# Patient Record
Sex: Female | Born: 1955
Health system: Southern US, Community
[De-identification: ages and names within clinical notes are randomized; demographics above are authoritative.]

## PROBLEM LIST (undated history)

## (undated) DIAGNOSIS — E785 Hyperlipidemia, unspecified: Secondary | ICD-10-CM

## (undated) DIAGNOSIS — Z860100 Personal history of colon polyps, unspecified: Secondary | ICD-10-CM

## (undated) DIAGNOSIS — E119 Type 2 diabetes mellitus without complications: Secondary | ICD-10-CM

## (undated) DIAGNOSIS — L680 Hirsutism: Secondary | ICD-10-CM

## (undated) DIAGNOSIS — E669 Obesity, unspecified: Secondary | ICD-10-CM

## (undated) DIAGNOSIS — E039 Hypothyroidism, unspecified: Secondary | ICD-10-CM

## (undated) HISTORY — PX: OTHER SURGICAL HISTORY: SHX169

## (undated) HISTORY — PX: COLONOSCOPY: SHX174

## (undated) HISTORY — PX: PILONIDAL CYST / SINUS EXCISION: SUR543

---

## 2004-01-07 ENCOUNTER — Ambulatory Visit: Payer: Self-pay | Admitting: Family Medicine

## 2005-01-05 ENCOUNTER — Ambulatory Visit: Payer: Self-pay | Admitting: Internal Medicine

## 2006-01-06 ENCOUNTER — Ambulatory Visit: Payer: Self-pay | Admitting: Internal Medicine

## 2006-01-12 ENCOUNTER — Ambulatory Visit: Payer: Self-pay | Admitting: Gastroenterology

## 2007-01-10 ENCOUNTER — Ambulatory Visit: Payer: Self-pay | Admitting: Internal Medicine

## 2008-01-12 ENCOUNTER — Ambulatory Visit: Payer: Self-pay | Admitting: Internal Medicine

## 2009-01-14 ENCOUNTER — Ambulatory Visit: Payer: Self-pay | Admitting: Unknown Physician Specialty

## 2010-01-15 ENCOUNTER — Ambulatory Visit: Payer: Self-pay | Admitting: Internal Medicine

## 2010-07-27 ENCOUNTER — Emergency Department: Payer: Self-pay | Admitting: Emergency Medicine

## 2010-07-31 ENCOUNTER — Ambulatory Visit: Payer: Self-pay | Admitting: Internal Medicine

## 2010-08-10 ENCOUNTER — Ambulatory Visit: Payer: Self-pay | Admitting: Internal Medicine

## 2010-09-10 ENCOUNTER — Ambulatory Visit: Payer: Self-pay | Admitting: Internal Medicine

## 2011-02-16 ENCOUNTER — Ambulatory Visit: Payer: Self-pay | Admitting: Unknown Physician Specialty

## 2012-02-24 ENCOUNTER — Ambulatory Visit: Payer: Self-pay | Admitting: Internal Medicine

## 2013-03-10 ENCOUNTER — Ambulatory Visit: Payer: Self-pay | Admitting: Family Medicine

## 2014-03-22 ENCOUNTER — Ambulatory Visit: Payer: Self-pay | Admitting: Family Medicine

## 2014-04-10 ENCOUNTER — Emergency Department: Payer: Self-pay | Admitting: Emergency Medicine

## 2015-01-10 HISTORY — PX: BREAST BIOPSY: SHX20

## 2015-03-21 ENCOUNTER — Other Ambulatory Visit: Payer: Self-pay | Admitting: Family Medicine

## 2015-03-21 DIAGNOSIS — Z1231 Encounter for screening mammogram for malignant neoplasm of breast: Secondary | ICD-10-CM

## 2015-03-26 ENCOUNTER — Ambulatory Visit
Admission: RE | Admit: 2015-03-26 | Discharge: 2015-03-26 | Disposition: A | Discharge status: Home / Self Care | Payer: BLUE CROSS/BLUE SHIELD | Source: Ambulatory Visit | Attending: Family Medicine | Admitting: Family Medicine

## 2015-03-26 ENCOUNTER — Ambulatory Visit: Admission: RE | Admit: 2015-03-26 | Payer: Self-pay | Source: Ambulatory Visit

## 2015-03-26 DIAGNOSIS — Z1231 Encounter for screening mammogram for malignant neoplasm of breast: Secondary | ICD-10-CM | POA: Diagnosis not present

## 2016-03-06 ENCOUNTER — Encounter: Payer: Self-pay | Admitting: *Deleted

## 2016-03-09 ENCOUNTER — Ambulatory Visit: Payer: BLUE CROSS/BLUE SHIELD | Admitting: Anesthesiology

## 2016-03-09 ENCOUNTER — Encounter: Payer: Self-pay | Admitting: Anesthesiology

## 2016-03-09 ENCOUNTER — Encounter
Admission: RE | Disposition: A | Discharge status: Home / Self Care | Payer: Self-pay | Source: Ambulatory Visit | Attending: Gastroenterology

## 2016-03-09 ENCOUNTER — Ambulatory Visit
Admission: RE | Admit: 2016-03-09 | Discharge: 2016-03-09 | Disposition: A | Discharge status: Home / Self Care | Payer: BLUE CROSS/BLUE SHIELD | Source: Ambulatory Visit | Attending: Gastroenterology | Admitting: Gastroenterology

## 2016-03-09 DIAGNOSIS — K5730 Diverticulosis of large intestine without perforation or abscess without bleeding: Secondary | ICD-10-CM | POA: Diagnosis not present

## 2016-03-09 DIAGNOSIS — Z1211 Encounter for screening for malignant neoplasm of colon: Secondary | ICD-10-CM | POA: Insufficient documentation

## 2016-03-09 DIAGNOSIS — E119 Type 2 diabetes mellitus without complications: Secondary | ICD-10-CM | POA: Insufficient documentation

## 2016-03-09 DIAGNOSIS — E785 Hyperlipidemia, unspecified: Secondary | ICD-10-CM | POA: Diagnosis not present

## 2016-03-09 DIAGNOSIS — Z86010 Personal history of colonic polyps: Secondary | ICD-10-CM | POA: Insufficient documentation

## 2016-03-09 DIAGNOSIS — F17200 Nicotine dependence, unspecified, uncomplicated: Secondary | ICD-10-CM | POA: Insufficient documentation

## 2016-03-09 DIAGNOSIS — K635 Polyp of colon: Secondary | ICD-10-CM | POA: Diagnosis not present

## 2016-03-09 DIAGNOSIS — Z6826 Body mass index (BMI) 26.0-26.9, adult: Secondary | ICD-10-CM | POA: Diagnosis not present

## 2016-03-09 DIAGNOSIS — Z79899 Other long term (current) drug therapy: Secondary | ICD-10-CM | POA: Insufficient documentation

## 2016-03-09 DIAGNOSIS — D128 Benign neoplasm of rectum: Secondary | ICD-10-CM | POA: Insufficient documentation

## 2016-03-09 DIAGNOSIS — E039 Hypothyroidism, unspecified: Secondary | ICD-10-CM | POA: Diagnosis not present

## 2016-03-09 DIAGNOSIS — D122 Benign neoplasm of ascending colon: Secondary | ICD-10-CM | POA: Insufficient documentation

## 2016-03-09 DIAGNOSIS — Z794 Long term (current) use of insulin: Secondary | ICD-10-CM | POA: Insufficient documentation

## 2016-03-09 HISTORY — DX: Personal history of colonic polyps: Z86.010

## 2016-03-09 HISTORY — DX: Hypothyroidism, unspecified: E03.9

## 2016-03-09 HISTORY — PX: COLONOSCOPY WITH PROPOFOL: SHX5780

## 2016-03-09 HISTORY — DX: Obesity, unspecified: E66.9

## 2016-03-09 HISTORY — DX: Hyperlipidemia, unspecified: E78.5

## 2016-03-09 HISTORY — DX: Hirsutism: L68.0

## 2016-03-09 HISTORY — DX: Type 2 diabetes mellitus without complications: E11.9

## 2016-03-09 HISTORY — DX: Personal history of colon polyps, unspecified: Z86.0100

## 2016-03-09 LAB — GLUCOSE, CAPILLARY: Glucose-Capillary: 138 mg/dL — ABNORMAL HIGH (ref 65–99)

## 2016-03-09 SURGERY — COLONOSCOPY WITH PROPOFOL
Anesthesia: General

## 2016-03-09 MED ORDER — SODIUM CHLORIDE 0.9 % IV SOLN
INTRAVENOUS | Status: DC
  Administered 2016-03-09: 1000 mL via INTRAVENOUS

## 2016-03-09 MED ORDER — EPHEDRINE SULFATE 50 MG/ML IJ SOLN
INTRAMUSCULAR | Status: DC | PRN
  Administered 2016-03-09: 10 mg via INTRAVENOUS
  Administered 2016-03-09: 20 mg via INTRAVENOUS
  Administered 2016-03-09 (×2): 10 mg via INTRAVENOUS

## 2016-03-09 MED ORDER — PROPOFOL 500 MG/50ML IV EMUL
INTRAVENOUS | Status: AC
  Filled 2016-03-09: qty 50

## 2016-03-09 MED ORDER — EPHEDRINE 5 MG/ML INJ
INTRAVENOUS | Status: AC
  Filled 2016-03-09: qty 10

## 2016-03-09 MED ORDER — LIDOCAINE HCL (CARDIAC) 20 MG/ML IV SOLN
INTRAVENOUS | Status: DC | PRN
  Administered 2016-03-09: 50 mg via INTRAVENOUS

## 2016-03-09 MED ORDER — PROPOFOL 10 MG/ML IV BOLUS
INTRAVENOUS | Status: DC | PRN
  Administered 2016-03-09 (×2): 50 mg via INTRAVENOUS
  Administered 2016-03-09: 20 mg via INTRAVENOUS
  Administered 2016-03-09 (×2): 30 mg via INTRAVENOUS
  Administered 2016-03-09: 10 mg via INTRAVENOUS
  Administered 2016-03-09: 30 mg via INTRAVENOUS
  Administered 2016-03-09: 50 mg via INTRAVENOUS
  Administered 2016-03-09: 20 mg via INTRAVENOUS
  Administered 2016-03-09 (×2): 50 mg via INTRAVENOUS

## 2016-03-09 MED ORDER — LACTATED RINGERS IV SOLN
INTRAVENOUS | Status: DC | PRN
  Administered 2016-03-09: 08:00:00 via INTRAVENOUS

## 2016-03-09 MED ORDER — PROPOFOL 10 MG/ML IV BOLUS
INTRAVENOUS | Status: AC
  Filled 2016-03-09: qty 20

## 2016-03-09 MED ORDER — SODIUM CHLORIDE 0.9 % IV SOLN: INTRAVENOUS | Status: DC

## 2016-03-09 MED ORDER — PROPOFOL 500 MG/50ML IV EMUL
INTRAVENOUS | Status: DC | PRN
  Administered 2016-03-09: 125 ug/kg/min via INTRAVENOUS

## 2016-03-09 NOTE — Anesthesia Post-op Follow-up Note (Cosign Needed)
Anesthesia QCDR form completed.        

## 2016-03-09 NOTE — Anesthesia Postprocedure Evaluation (Signed)
Anesthesia Post Note  Patient: Lauren Pierce  Procedure(s) Performed: Procedure(s) (LRB): COLONOSCOPY WITH PROPOFOL (N/A)  Patient location during evaluation: Endoscopy Anesthesia Type: General Level of consciousness: awake and alert Pain management: pain level controlled Vital Signs Assessment: post-procedure vital signs reviewed and stable Respiratory status: spontaneous breathing, nonlabored ventilation, respiratory function stable and patient connected to nasal cannula oxygen Cardiovascular status: blood pressure returned to baseline and stable Postop Assessment: no signs of nausea or vomiting Anesthetic complications: no     Last Vitals:  Vitals:   03/09/16 0908 03/09/16 0918  BP: (!) 103/91 123/70  Pulse: 87 84  Resp: 18 14  Temp:      Last Pain:  Vitals:   03/09/16 0848  TempSrc: Tympanic                 Ashauna Bertholf S

## 2016-03-09 NOTE — H&P (Signed)
Outpatient short stay form Pre-procedure 03/09/2016 7:28 AM Lollie Sails MD  Primary Physician: Dr Derinda Late  Reason for visit:  Colonoscopy  History of present illness:  Patient is a 61 year old female presenting today as above. She has a history of colon polyps her last colonoscopy was 10 years ago. She did tolerate her prep well. She takes no aspirin or blood thinning agents.    Current Facility-Administered Medications:  .  0.9 %  sodium chloride infusion, , Intravenous, Continuous, Lollie Sails, MD, Last Rate: 20 mL/hr at 03/09/16 0702, 1,000 mL at 03/09/16 0702 .  0.9 %  sodium chloride infusion, , Intravenous, Continuous, Lollie Sails, MD  Prescriptions Prior to Admission  Medication Sig Dispense Refill Last Dose  . Acetylcarn-Alpha Lipoic Acid 400-200 MG CAPS Take 100 mg by mouth 3 (three) times daily.   Past Week at Unknown time  . calcium-vitamin D (OSCAL WITH D) 500-200 MG-UNIT tablet Take 1 tablet by mouth 2 (two) times daily.   Past Week at Unknown time  . Chromium Picolinate 200 MCG CAPS Take 200 mcg by mouth 3 (three) times daily.   Past Week at Unknown time  . Cinnamon Bark POWD 125 mg by Does not apply route 3 (three) times daily.   Past Week at Unknown time  . co-enzyme Q-10 50 MG capsule Take 100 mg by mouth 2 (two) times daily.   Past Week at Unknown time  . glucose blood test strip 1 each by Other route as needed for other. Use as instructed   Past Week at Unknown time  . Gymnema Sylvestris Leaf POWD 250 mg by Does not apply route 2 (two) times daily.   Past Week at Unknown time  . insulin aspart (NOVOLOG FLEXPEN) 100 UNIT/ML FlexPen Inject 7 Units into the skin 3 (three) times daily with meals.   Past Week at Unknown time  . insulin aspart (NOVOLOG) 100 UNIT/ML injection Inject 100 Units into the skin 3 (three) times daily before meals.   Past Week at Unknown time  . Insulin Glargine (TOUJEO SOLOSTAR [place]) Inject 15 Units into the skin Nightly.    03/08/2016 at Unknown time  . Insulin Glargine (TOUJEO SOLOSTAR) 300 UNIT/ML SOPN Inject 24 Units into the skin daily at 6 PM.   Past Week at Unknown time  . levothyroxine (SYNTHROID, LEVOTHROID) 75 MCG tablet Take 75 mcg by mouth daily before breakfast.   Past Week at Unknown time  . lisinopril (PRINIVIL,ZESTRIL) 5 MG tablet Take 5 mg by mouth daily.   Past Week at Unknown time  . metFORMIN (GLUCOPHAGE) 1000 MG tablet Take 1,000 mg by mouth 2 (two) times daily with a meal.   Past Week at Unknown time  . Multiple Vitamin (MULTIVITAMIN) tablet Take 1 tablet by mouth daily.   Past Week at Unknown time  . Nutritional Supplements (PYCNOGENOL PO) Take 50 mg by mouth 3 (three) times daily.   Past Week at Unknown time  . OMEGA-3 FATTY ACIDS-VITAMIN E PO Take 1,000 mg by mouth daily.   Past Week at Unknown time  . simvastatin (ZOCOR) 10 MG tablet Take 10 mg by mouth daily.   Past Week at Unknown time  . Turmeric, Curcuma [place], (TURMERIC ROOT) POWD by Does not apply route daily.   Past Week at Unknown time     No Known Allergies   Past Medical History:  Diagnosis Date  . Diabetes mellitus without complication ([place])   . Hirsutism   . History of  colon polyps   . Hyperlipidemia   . Hypothyroidism   . Obesity     Review of systems:      Physical Exam    Heart and lungs: Regular rate and rhythm without rub or gallop, lungs are bilaterally clear.    HEENT: Normocephalic atraumatic eyes are anicteric     Other:     Pertinant exam for procedure: Soft nontender nondistended bowel sounds positive normoactive.    Planned proceedures: Colonoscopy and indicated procedures. I have discussed the risks benefits and complications of procedures to include not limited to bleeding, infection, perforation and the risk of sedation and the patient wishes to proceed.    Lollie Sails, MD Gastroenterology 03/09/2016  7:28 AM

## 2016-03-09 NOTE — Transfer of Care (Signed)
Immediate Anesthesia Transfer of Care Note  Patient: Lauren Pierce  Procedure(s) Performed: Procedure(s): COLONOSCOPY WITH PROPOFOL (N/A)  Patient Location: PACU  Anesthesia Type:General  Level of Consciousness: sedated  Airway & Oxygen Therapy: Patient Spontanous Breathing  Post-op Assessment: Report given to RN and Post -op Vital signs reviewed and stable  Post vital signs: Reviewed and stable  Last Vitals:  Vitals:   03/09/16 0645  BP: (!) 113/56  Pulse: 86  Resp: 16  Temp: (!) 35.6 C    Last Pain:  Vitals:   03/09/16 0645  TempSrc: Tympanic         Complications: No apparent anesthesia complications

## 2016-03-09 NOTE — Brief Op Note (Signed)
Polyp cold snare in proximal transverse colon resected but not retrieved

## 2016-03-09 NOTE — Op Note (Signed)
[facility] Gastroenterology Patient Name: Lauren Pierce Procedure Date: 03/09/2016 7:01 AM MRN: [id number] Account #: [id number] Date of Birth: 31-May-1955 Admit Type: Outpatient Age: 61 Room: [facility] ENDO ROOM 3 Gender: Female Note Status: Finalized Procedure:            Colonoscopy Indications:          Personal history of colonic polyps Providers:            Lollie Sails, MD Referring MD:         Caprice Renshaw MD (Referring MD) Medicines:            Monitored Anesthesia Care Complications:        No immediate complications. Procedure:            Pre-Anesthesia Assessment:                       - ASA Grade Assessment: II - A patient with mild                        systemic disease.                       After obtaining informed consent, the colonoscope was                        passed under direct vision. Throughout the procedure,                        the patient's blood pressure, pulse, and oxygen                        saturations were monitored continuously. The                        Colonoscope was introduced through the anus and                        advanced to the the cecum, identified by appendiceal                        orifice and ileocecal valve. The colonoscopy was                        performed without difficulty. The patient tolerated the                        procedure well. The quality of the bowel preparation                        was fair. Findings:      Multiple small-mouthed diverticula were found in the sigmoid colon,       descending colon, transverse colon and ascending colon.      A 4 mm polyp was found in the proximal transverse colon. The polyp was       flat. The polyp was removed with a cold snare. Resection was complete,       but the polyp tissue was not retrieved.      A 14 mm polyp was found in the proximal ascending colon. The polyp was       sessile. The polyp was removed with a lift  and cut technique using a  hot       snare. The polyp was removed with a piecemeal technique using a hot       snare. The polyp was removed with a cold snare. Resection and retrieval       were complete.      A 3 mm polyp was found in the splenic flexure. The polyp was sessile.       The polyp was removed with a cold biopsy forceps. Resection and       retrieval were complete.      A 2 mm polyp was found in the descending colon. The polyp was sessile.       The polyp was removed with a cold biopsy forceps. Resection and       retrieval were complete.      A 2 mm polyp was found in the distal sigmoid colon. The polyp was       sessile. The polyp was removed with a cold biopsy forceps. Resection and       retrieval were complete.      A 10 mm polyp was found in the rectum. The polyp was semi-pedunculated.       The polyp was removed with a cold snare. Resection and retrieval were       complete.      The digital rectal exam was normal. Impression:           - Preparation of the colon was fair.                       - Diverticulosis in the sigmoid colon, in the                        descending colon, in the transverse colon and in the                        ascending colon.                       - One 4 mm polyp in the proximal transverse colon,                        removed with a cold snare. Complete resection. Polyp                        tissue not retrieved.                       - One 14 mm polyp in the proximal ascending colon,                        removed with a cold snare, removed using lift and cut                        and a hot snare and removed piecemeal using a hot                        snare. Resected and retrieved.                       - One 3 mm polyp at the splenic flexure, removed with a  cold biopsy forceps. Resected and retrieved.                       - One 2 mm polyp in the descending colon, removed with                        a cold biopsy forceps. Resected and  retrieved.                       - One 2 mm polyp in the distal sigmoid colon, removed                        with a cold biopsy forceps. Resected and retrieved.                       - One 10 mm polyp in the rectum, removed with a cold                        snare. Resected and retrieved. Recommendation:       - Discharge patient to home. Procedure Code(s):    --- Professional ---                       [phone], Colonoscopy, flexible; with removal of tumor(s),                        polyp(s), or other lesion(s) by snare technique                       45380, 29, Colonoscopy, flexible; with biopsy, single                        or multiple Diagnosis Code(s):    --- Professional ---                       D12.2, Benign neoplasm of ascending colon                       D12.3, Benign neoplasm of transverse colon (hepatic                        flexure or splenic flexure)                       D12.4, Benign neoplasm of descending colon                       D12.5, Benign neoplasm of sigmoid colon                       K62.1, Rectal polyp                       Z86.010, Personal history of colonic polyps                       K57.30, Diverticulosis of large intestine without                        perforation or abscess without bleeding CPT copyright 2016 American Medical Association. All rights reserved. The codes documented in this report  are preliminary and upon coder review may  be revised to meet current compliance requirements. Lollie Sails, MD 03/09/2016 8:49:13 AM This report has been signed electronically. Number of Addenda: 0 Note Initiated On: 03/09/2016 7:01 AM Scope Withdrawal Time: 0 hours 40 minutes 18 seconds  Total Procedure Duration: 0 hours 55 minutes 19 seconds       [facility]

## 2016-03-09 NOTE — Anesthesia Preprocedure Evaluation (Signed)
Anesthesia Evaluation  Patient identified by MRN, date of birth, ID band Patient awake    Reviewed: Allergy & Precautions, NPO status , Patient's Chart, lab work & pertinent test results, reviewed documented beta blocker date and time   Airway Mallampati: II  TM Distance: >3 FB     Dental  (+) Chipped   Pulmonary Current Smoker,           Cardiovascular      Neuro/Psych    GI/Hepatic   Endo/Other  diabetes, Type 2Hypothyroidism   Renal/GU      Musculoskeletal   Abdominal   Peds  Hematology   Anesthesia Other Findings   Reproductive/Obstetrics                             Anesthesia Physical Anesthesia Plan  ASA: II  Anesthesia Plan: General   Post-op Pain Management:    Induction: Intravenous  Airway Management Planned: Nasal Cannula  Additional Equipment:   Intra-op Plan:   Post-operative Plan:   Informed Consent: I have reviewed the patients History and Physical, chart, labs and discussed the procedure including the risks, benefits and alternatives for the proposed anesthesia with the patient or authorized representative who has indicated his/her understanding and acceptance.     Plan Discussed with: CRNA  Anesthesia Plan Comments:         Anesthesia Quick Evaluation

## 2016-03-10 ENCOUNTER — Encounter: Payer: Self-pay | Admitting: Gastroenterology

## 2016-03-10 LAB — SURGICAL PATHOLOGY

## 2016-03-19 ENCOUNTER — Other Ambulatory Visit: Payer: Self-pay | Admitting: Family Medicine

## 2016-03-19 DIAGNOSIS — Z1231 Encounter for screening mammogram for malignant neoplasm of breast: Secondary | ICD-10-CM

## 2016-03-30 ENCOUNTER — Ambulatory Visit: Payer: BLUE CROSS/BLUE SHIELD

## 2016-03-31 ENCOUNTER — Ambulatory Visit
Admission: RE | Admit: 2016-03-31 | Discharge: 2016-03-31 | Disposition: A | Discharge status: Home / Self Care | Payer: BLUE CROSS/BLUE SHIELD | Source: Ambulatory Visit | Attending: Family Medicine | Admitting: Family Medicine

## 2016-03-31 DIAGNOSIS — Z1231 Encounter for screening mammogram for malignant neoplasm of breast: Secondary | ICD-10-CM | POA: Insufficient documentation

## 2017-03-18 ENCOUNTER — Other Ambulatory Visit: Payer: Self-pay | Admitting: Family Medicine

## 2017-03-18 DIAGNOSIS — Z1231 Encounter for screening mammogram for malignant neoplasm of breast: Secondary | ICD-10-CM

## 2017-04-01 ENCOUNTER — Ambulatory Visit
Admission: RE | Admit: 2017-04-01 | Discharge: 2017-04-01 | Disposition: A | Discharge status: Home / Self Care | Payer: BLUE CROSS/BLUE SHIELD | Source: Ambulatory Visit | Attending: Family Medicine | Admitting: Family Medicine

## 2017-04-01 DIAGNOSIS — Z1231 Encounter for screening mammogram for malignant neoplasm of breast: Secondary | ICD-10-CM | POA: Insufficient documentation

## 2018-04-18 ENCOUNTER — Other Ambulatory Visit: Payer: Self-pay | Admitting: Family Medicine

## 2018-04-18 DIAGNOSIS — Z1231 Encounter for screening mammogram for malignant neoplasm of breast: Secondary | ICD-10-CM

## 2018-04-26 ENCOUNTER — Ambulatory Visit
Admission: RE | Admit: 2018-04-26 | Discharge: 2018-04-26 | Disposition: A | Discharge status: Home / Self Care | Payer: PRIVATE HEALTH INSURANCE | Source: Ambulatory Visit | Attending: Family Medicine | Admitting: Family Medicine

## 2018-04-26 ENCOUNTER — Other Ambulatory Visit: Payer: Self-pay

## 2018-04-26 ENCOUNTER — Encounter (INDEPENDENT_AMBULATORY_CARE_PROVIDER_SITE_OTHER): Payer: Self-pay

## 2018-04-26 DIAGNOSIS — Z1231 Encounter for screening mammogram for malignant neoplasm of breast: Secondary | ICD-10-CM | POA: Insufficient documentation

## 2020-07-05 ENCOUNTER — Other Ambulatory Visit: Payer: Self-pay | Admitting: Family Medicine

## 2020-07-05 DIAGNOSIS — Z1231 Encounter for screening mammogram for malignant neoplasm of breast: Secondary | ICD-10-CM

## 2020-07-16 ENCOUNTER — Other Ambulatory Visit: Payer: Self-pay | Admitting: *Deleted

## 2020-07-16 ENCOUNTER — Inpatient Hospital Stay
Admission: RE | Admit: 2020-07-16 | Discharge: 2020-07-16 | Disposition: A | Discharge status: Home / Self Care | Payer: Self-pay | Source: Ambulatory Visit | Attending: *Deleted | Admitting: *Deleted

## 2020-07-16 ENCOUNTER — Ambulatory Visit
Admission: RE | Admit: 2020-07-16 | Discharge: 2020-07-16 | Disposition: A | Discharge status: Home / Self Care | Payer: Medicare HMO | Source: Ambulatory Visit | Attending: Family Medicine | Admitting: Family Medicine

## 2020-07-16 ENCOUNTER — Other Ambulatory Visit: Payer: Self-pay

## 2020-07-16 DIAGNOSIS — Z1231 Encounter for screening mammogram for malignant neoplasm of breast: Secondary | ICD-10-CM

## 2020-08-23 DIAGNOSIS — E1069 Type 1 diabetes mellitus with other specified complication: Secondary | ICD-10-CM | POA: Diagnosis not present

## 2020-08-23 DIAGNOSIS — E109 Type 1 diabetes mellitus without complications: Secondary | ICD-10-CM | POA: Diagnosis not present

## 2020-08-23 DIAGNOSIS — E039 Hypothyroidism, unspecified: Secondary | ICD-10-CM | POA: Diagnosis not present

## 2020-08-23 DIAGNOSIS — E785 Hyperlipidemia, unspecified: Secondary | ICD-10-CM | POA: Diagnosis not present

## 2020-10-08 DIAGNOSIS — Z86010 Personal history of colonic polyps: Secondary | ICD-10-CM | POA: Diagnosis not present

## 2020-10-16 DIAGNOSIS — E109 Type 1 diabetes mellitus without complications: Secondary | ICD-10-CM | POA: Diagnosis not present

## 2020-11-07 DIAGNOSIS — Z79899 Other long term (current) drug therapy: Secondary | ICD-10-CM | POA: Diagnosis not present

## 2020-11-07 DIAGNOSIS — E1069 Type 1 diabetes mellitus with other specified complication: Secondary | ICD-10-CM | POA: Diagnosis not present

## 2020-11-07 DIAGNOSIS — E785 Hyperlipidemia, unspecified: Secondary | ICD-10-CM | POA: Diagnosis not present

## 2020-11-14 DIAGNOSIS — E785 Hyperlipidemia, unspecified: Secondary | ICD-10-CM | POA: Diagnosis not present

## 2020-11-14 DIAGNOSIS — E039 Hypothyroidism, unspecified: Secondary | ICD-10-CM | POA: Diagnosis not present

## 2020-11-14 DIAGNOSIS — Z79899 Other long term (current) drug therapy: Secondary | ICD-10-CM | POA: Diagnosis not present

## 2020-11-14 DIAGNOSIS — E119 Type 2 diabetes mellitus without complications: Secondary | ICD-10-CM | POA: Diagnosis not present

## 2020-11-15 DIAGNOSIS — E109 Type 1 diabetes mellitus without complications: Secondary | ICD-10-CM | POA: Diagnosis not present

## 2020-12-16 DIAGNOSIS — E109 Type 1 diabetes mellitus without complications: Secondary | ICD-10-CM | POA: Diagnosis not present

## 2021-01-10 DIAGNOSIS — D0461 Carcinoma in situ of skin of right upper limb, including shoulder: Secondary | ICD-10-CM | POA: Diagnosis not present

## 2021-01-10 DIAGNOSIS — D0362 Melanoma in situ of left upper limb, including shoulder: Secondary | ICD-10-CM | POA: Diagnosis not present

## 2021-01-13 DIAGNOSIS — L821 Other seborrheic keratosis: Secondary | ICD-10-CM | POA: Diagnosis not present

## 2021-01-13 DIAGNOSIS — D225 Melanocytic nevi of trunk: Secondary | ICD-10-CM | POA: Diagnosis not present

## 2021-01-13 DIAGNOSIS — D2262 Melanocytic nevi of left upper limb, including shoulder: Secondary | ICD-10-CM | POA: Diagnosis not present

## 2021-01-13 DIAGNOSIS — D485 Neoplasm of uncertain behavior of skin: Secondary | ICD-10-CM | POA: Diagnosis not present

## 2021-01-13 DIAGNOSIS — L570 Actinic keratosis: Secondary | ICD-10-CM | POA: Diagnosis not present

## 2021-01-13 DIAGNOSIS — D2271 Melanocytic nevi of right lower limb, including hip: Secondary | ICD-10-CM | POA: Diagnosis not present

## 2021-01-13 DIAGNOSIS — D2272 Melanocytic nevi of left lower limb, including hip: Secondary | ICD-10-CM | POA: Diagnosis not present

## 2021-01-13 DIAGNOSIS — X32XXXA Exposure to sunlight, initial encounter: Secondary | ICD-10-CM | POA: Diagnosis not present

## 2021-01-13 DIAGNOSIS — D2261 Melanocytic nevi of right upper limb, including shoulder: Secondary | ICD-10-CM | POA: Diagnosis not present

## 2021-01-15 DIAGNOSIS — E109 Type 1 diabetes mellitus without complications: Secondary | ICD-10-CM | POA: Diagnosis not present

## 2021-01-16 ENCOUNTER — Encounter: Payer: Self-pay | Admitting: *Deleted

## 2021-01-17 ENCOUNTER — Encounter
Admission: RE | Disposition: A | Discharge status: Home / Self Care | Payer: Self-pay | Source: Ambulatory Visit | Attending: Gastroenterology

## 2021-01-17 ENCOUNTER — Ambulatory Visit
Admission: RE | Admit: 2021-01-17 | Discharge: 2021-01-17 | Disposition: A | Discharge status: Home / Self Care | Payer: Medicare HMO | Source: Ambulatory Visit | Attending: Gastroenterology | Admitting: Gastroenterology

## 2021-01-17 ENCOUNTER — Ambulatory Visit: Payer: Medicare HMO | Admitting: Anesthesiology

## 2021-01-17 DIAGNOSIS — E669 Obesity, unspecified: Secondary | ICD-10-CM | POA: Insufficient documentation

## 2021-01-17 DIAGNOSIS — E785 Hyperlipidemia, unspecified: Secondary | ICD-10-CM | POA: Insufficient documentation

## 2021-01-17 DIAGNOSIS — Z6826 Body mass index (BMI) 26.0-26.9, adult: Secondary | ICD-10-CM | POA: Diagnosis not present

## 2021-01-17 DIAGNOSIS — E039 Hypothyroidism, unspecified: Secondary | ICD-10-CM | POA: Insufficient documentation

## 2021-01-17 DIAGNOSIS — K635 Polyp of colon: Secondary | ICD-10-CM | POA: Diagnosis not present

## 2021-01-17 DIAGNOSIS — E109 Type 1 diabetes mellitus without complications: Secondary | ICD-10-CM | POA: Insufficient documentation

## 2021-01-17 DIAGNOSIS — K5730 Diverticulosis of large intestine without perforation or abscess without bleeding: Secondary | ICD-10-CM | POA: Insufficient documentation

## 2021-01-17 DIAGNOSIS — Z1211 Encounter for screening for malignant neoplasm of colon: Secondary | ICD-10-CM | POA: Diagnosis not present

## 2021-01-17 DIAGNOSIS — Z86010 Personal history of colonic polyps: Secondary | ICD-10-CM | POA: Diagnosis not present

## 2021-01-17 DIAGNOSIS — Z09 Encounter for follow-up examination after completed treatment for conditions other than malignant neoplasm: Secondary | ICD-10-CM | POA: Diagnosis present

## 2021-01-17 DIAGNOSIS — F17200 Nicotine dependence, unspecified, uncomplicated: Secondary | ICD-10-CM | POA: Diagnosis not present

## 2021-01-17 HISTORY — PX: COLONOSCOPY WITH PROPOFOL: SHX5780

## 2021-01-17 LAB — GLUCOSE, CAPILLARY: Glucose-Capillary: 225 mg/dL — ABNORMAL HIGH (ref 70–99)

## 2021-01-17 SURGERY — COLONOSCOPY WITH PROPOFOL
Anesthesia: General

## 2021-01-17 MED ORDER — LIDOCAINE HCL (PF) 2 % IJ SOLN
INTRAMUSCULAR | Status: AC
  Filled 2021-01-17: qty 5

## 2021-01-17 MED ORDER — PROPOFOL 500 MG/50ML IV EMUL
INTRAVENOUS | Status: AC
  Filled 2021-01-17: qty 50

## 2021-01-17 MED ORDER — SODIUM CHLORIDE 0.9 % IV SOLN
INTRAVENOUS | Status: DC
  Administered 2021-01-17: 20 mL/h via INTRAVENOUS

## 2021-01-17 MED ORDER — LIDOCAINE HCL (CARDIAC) PF 100 MG/5ML IV SOSY
PREFILLED_SYRINGE | INTRAVENOUS | Status: DC | PRN
  Administered 2021-01-17: 50 mg via INTRAVENOUS

## 2021-01-17 MED ORDER — PROPOFOL 10 MG/ML IV BOLUS
INTRAVENOUS | Status: DC | PRN
  Administered 2021-01-17: 60 mg via INTRAVENOUS
  Administered 2021-01-17 (×2): 20 mg via INTRAVENOUS

## 2021-01-17 MED ORDER — PROPOFOL 500 MG/50ML IV EMUL
INTRAVENOUS | Status: DC | PRN
  Administered 2021-01-17: 150 ug/kg/min via INTRAVENOUS

## 2021-01-17 NOTE — Anesthesia Postprocedure Evaluation (Signed)
Anesthesia Post Note  Patient: Lauren Pierce  Procedure(s) Performed: COLONOSCOPY WITH PROPOFOL  Patient location during evaluation: Endoscopy Anesthesia Type: General Level of consciousness: awake and alert Pain management: pain level controlled Vital Signs Assessment: post-procedure vital signs reviewed and stable Respiratory status: spontaneous breathing, nonlabored ventilation, respiratory function stable and patient connected to nasal cannula oxygen Cardiovascular status: blood pressure returned to baseline and stable Postop Assessment: no apparent nausea or vomiting Anesthetic complications: no   No notable events documented.   Last Vitals:  Vitals:   01/17/21 0809 01/17/21 0819  BP: (!) 94/55 110/67  Pulse: 84 81  Resp: (!) 22 20  Temp: (!) 35.8 C   SpO2: 97% 98%    Last Pain:  Vitals:   01/17/21 0819  TempSrc:   PainSc: 0-No pain                 Arita Miss

## 2021-01-17 NOTE — Op Note (Signed)
[facility] Gastroenterology Patient Name: Lauren Pierce Procedure Date: 01/17/2021 6:50 AM MRN: [id number] Account #: [id number] Date of Birth: [birth year] Admit Type: Outpatient Age: 65 Room: [facility] ENDO ROOM 3 Gender: Female Note Status: Finalized Instrument Name: Park Meo [id number] Procedure:             Colonoscopy Indications:           Surveillance: Personal history of adenomatous polyps                         on last colonoscopy 3 years ago Providers:             Andrey Farmer MD, MD Referring MD:          Caprice Renshaw MD (Referring MD) Medicines:             Monitored Anesthesia Care Complications:         No immediate complications. Estimated blood loss:                         Minimal. Procedure:             Pre-Anesthesia Assessment:                        - Prior to the procedure, a History and Physical was                         performed, and patient medications and allergies were                         reviewed. The patient is competent. The risks and                         benefits of the procedure and the sedation options and                         risks were discussed with the patient. All questions                         were answered and informed consent was obtained.                         Patient identification and proposed procedure were                         verified by the physician, the nurse, the anesthetist                         and the technician in the endoscopy suite. Mental                         Status Examination: alert and oriented. Airway                         Examination: normal oropharyngeal airway and neck                         mobility. Respiratory Examination: clear to  auscultation. CV Examination: normal. Prophylactic                         Antibiotics: The patient does not require prophylactic                         antibiotics. Prior Anticoagulants: The patient has                          taken no previous anticoagulant or antiplatelet                         agents. ASA Grade Assessment: II - A patient with mild                         systemic disease. After reviewing the risks and                         benefits, the patient was deemed in satisfactory                         condition to undergo the procedure. The anesthesia                         plan was to use monitored anesthesia care (MAC).                         Immediately prior to administration of medications,                         the patient was re-assessed for adequacy to receive                         sedatives. The heart rate, respiratory rate, oxygen                         saturations, blood pressure, adequacy of pulmonary                         ventilation, and response to care were monitored                         throughout the procedure. The physical status of the                         patient was re-assessed after the procedure.                        After obtaining informed consent, the colonoscope was                         passed under direct vision. Throughout the procedure,                         the patient's blood pressure, pulse, and oxygen                         saturations were monitored continuously. The  Colonoscope was introduced through the anus and                         advanced to the the cecum, identified by appendiceal                         orifice and ileocecal valve. The colonoscopy was                         performed without difficulty. The patient tolerated                         the procedure well. The quality of the bowel                         preparation was good. Findings:      The perianal and digital rectal examinations were normal.      A 1 mm polyp was found in the transverse colon. The polyp was sessile.       The polyp was removed with a jumbo cold forceps. Resection and retrieval       were complete.  Estimated blood loss was minimal.      A few small-mouthed diverticula were found in the sigmoid colon.      The exam was otherwise without abnormality on direct and retroflexion       views. Impression:            - One 1 mm polyp in the transverse colon, removed with                         a jumbo cold forceps. Resected and retrieved.                        - Diverticulosis in the sigmoid colon.                        - The examination was otherwise normal on direct and                         retroflexion views. Recommendation:        - Discharge patient to home.                        - Resume previous diet.                        - Continue present medications.                        - Await pathology results.                        - Repeat colonoscopy in 5 years for surveillance.                        - Return to referring physician as previously                         scheduled. Procedure Code(s):     --- Professional ---  45380, Colonoscopy, flexible; with biopsy, single or                         multiple Diagnosis Code(s):     --- Professional ---                        Z86.010, Personal history of colonic polyps                        K63.5, Polyp of colon                        K57.30, Diverticulosis of large intestine without                         perforation or abscess without bleeding CPT copyright 2019 American Medical Association. All rights reserved. The codes documented in this report are preliminary and upon coder review may  be revised to meet current compliance requirements. Andrey Farmer MD, MD 01/17/2021 8:09:28 AM Number of Addenda: 0 Note Initiated On: 01/17/2021 6:50 AM Scope Withdrawal Time: 0 hours 9 minutes 30 seconds  Total Procedure Duration: 0 hours 15 minutes 7 seconds  Estimated Blood Loss:  Estimated blood loss was minimal.      [facility]

## 2021-01-17 NOTE — Interval H&P Note (Signed)
History and Physical Interval Note:  01/17/2021 7:47 AM  Lauren Pierce  has presented today for surgery, with the diagnosis of H/O Adenomatous Polyps (Z86.010).  The various methods of treatment have been discussed with the patient and family. After consideration of risks, benefits and other options for treatment, the patient has consented to  Procedure(s) with comments: COLONOSCOPY WITH PROPOFOL (N/A) - IDDM as a surgical intervention.  The patient's history has been reviewed, patient examined, no change in status, stable for surgery.  I have reviewed the patient's chart and labs.  Questions were answered to the patient's satisfaction.     Lesly Rubenstein  Ok to proceed with colonoscopy

## 2021-01-17 NOTE — Transfer of Care (Signed)
Immediate Anesthesia Transfer of Care Note  Patient: Lauren Pierce  Procedure(s) Performed: COLONOSCOPY WITH PROPOFOL  Patient Location: Endoscopy Unit  Anesthesia Type:General  Level of Consciousness: awake, alert  and oriented  Airway & Oxygen Therapy: Patient Spontanous Breathing  Post-op Assessment: Report given to RN and Post -op Vital signs reviewed and stable  Post vital signs: Reviewed and stable  Last Vitals:  Vitals Value Taken Time  BP 94/55 01/17/21 0809  Temp    Pulse 90 01/17/21 0809  Resp 22 01/17/21 0809  SpO2 99 % 01/17/21 0809  Vitals shown include unvalidated device data.  Last Pain:  Vitals:   01/17/21 0808  TempSrc:   PainSc: Asleep         Complications: No notable events documented.

## 2021-01-17 NOTE — H&P (Signed)
Outpatient short stay form Pre-procedure 01/17/2021  Lesly Rubenstein, MD  Primary Physician: Derinda Late, MD  Reason for visit:  Surveillance colonoscopy  History of present illness:    65 y/o lady with history of polyps here for surveillance colonoscopy. No blood thinners. No family history of GI malignancies. No abdominal surgeries.    Current Facility-Administered Medications:    0.9 %  sodium chloride infusion, , Intravenous, Continuous, Alic Hilburn, Hilton Cork, MD, Last Rate: 20 mL/hr at 01/17/21 0739, Continued from Pre-op at 01/17/21 0739  Medications Prior to Admission  Medication Sig Dispense Refill Last Dose   Acetylcarn-Alpha Lipoic Acid 400-200 MG CAPS Take 100 mg by mouth 3 (three) times daily.   Past Week   Ascorbic Acid (VITAMIN C) 1000 MG tablet Take 1,000 mg by mouth daily.   Past Week   calcium-vitamin D (OSCAL WITH D) 500-200 MG-UNIT tablet Take 1 tablet by mouth 2 (two) times daily.   Past Week   Cholecalciferol 25 MCG (1000 UT) tablet Take 1,000 Units by mouth daily.   Past Week   Chromium Picolinate 200 MCG CAPS Take 200 mcg by mouth 3 (three) times daily.   Past Week   Cinnamon Bark POWD 125 mg by Does not apply route 3 (three) times daily.   Past Week   co-enzyme Q-10 50 MG capsule Take 100 mg by mouth 2 (two) times daily.   Past Week   glucose blood test strip 1 each by Other route as needed for other. Use as instructed   Past Week   Gymnema Sylvestris Leaf POWD 250 mg by Does not apply route 2 (two) times daily.   Past Week   insulin aspart (NOVOLOG) 100 UNIT/ML FlexPen Inject 7 Units into the skin 3 (three) times daily with meals.   Past Week   insulin aspart (NOVOLOG) 100 UNIT/ML injection Inject 100 Units into the skin 3 (three) times daily before meals.   Past Week   Insulin Glargine (TOUJEO SOLOSTAR [place]) Inject 15 Units into the skin Nightly.   01/16/2021   Insulin Glargine 300 UNIT/ML SOPN Inject 24 Units into the skin daily at 6 PM.   Past Week    levothyroxine (SYNTHROID, LEVOTHROID) 75 MCG tablet Take 75 mcg by mouth daily before breakfast.   Past Week   lisinopril (PRINIVIL,ZESTRIL) 5 MG tablet Take 5 mg by mouth daily.   Past Week   metFORMIN (GLUCOPHAGE) 1000 MG tablet Take 1,000 mg by mouth 2 (two) times daily with a meal.   Past Week   Multiple Vitamin (MULTIVITAMIN) tablet Take 1 tablet by mouth daily.   Past Week   Nutritional Supplements (PYCNOGENOL PO) Take 50 mg by mouth 3 (three) times daily.   Past Week   OMEGA-3 FATTY ACIDS-VITAMIN E PO Take 1,000 mg by mouth daily.   Past Week   simvastatin (ZOCOR) 10 MG tablet Take 10 mg by mouth daily.   Past Week   Turmeric, Cur[place]uma [place], (TURMERIC ROOT) POWD by Does not apply route daily.   Past Week     No Known Allergies   Past Medical History:  Diagnosis Date   Diabetes mellitus without complication ([place])    Hirsutism    History of colon polyps    Hyperlipidemia    Hypothyroidism    Obesity     Review of systems:  Otherwise negative.    Physical Exam  Gen: Alert, oriented. Appears stated age.  HEENT: PERRLA. Lungs: No respiratory distress CV: RRR Abd: soft, benign, no  masses Ext: No edema    Planned procedures: Proceed with colonoscopy. The patient understands the nature of the planned procedure, indications, risks, alternatives and potential complications including but not limited to bleeding, infection, perforation, damage to internal organs and possible oversedation/side effects from anesthesia. The patient agrees and gives consent to proceed.  Please refer to procedure notes for findings, recommendations and patient disposition/instructions.     Lesly Rubenstein, MD [facility]

## 2021-01-17 NOTE — Anesthesia Preprocedure Evaluation (Signed)
Anesthesia Evaluation  Patient identified by MRN, date of birth, ID band Patient awake    Reviewed: Allergy & Precautions, NPO status , Patient's Chart, lab work & pertinent test results  History of Anesthesia Complications Negative for: history of anesthetic complications  Airway Mallampati: II  TM Distance: <3 FB Neck ROM: Full    Dental no notable dental hx. (+) Teeth Intact   Pulmonary neg sleep apnea, neg COPD, Current SmokerPatient did not abstain from smoking.,    Pulmonary exam normal breath sounds clear to auscultation       Cardiovascular Exercise Tolerance: Good METS(-) hypertension(-) CAD and (-) Past MI negative cardio ROS  (-) dysrhythmias  Rhythm:Regular Rate:Normal - Systolic murmurs    Neuro/Psych negative neurological ROS  negative psych ROS   GI/Hepatic neg GERD  ,(+)     (-) substance abuse  ,   Endo/Other  diabetes, Well Controlled, Type 1Hypothyroidism   Renal/GU negative Renal ROS     Musculoskeletal   Abdominal   Peds  Hematology   Anesthesia Other Findings Past Medical History: No date: Diabetes mellitus without complication ([facility]) No date: Hirsutism No date: History of colon polyps No date: Hyperlipidemia No date: Hypothyroidism No date: Obesity  Reproductive/Obstetrics                             Anesthesia Physical Anesthesia Plan  ASA: 3  Anesthesia Plan: General   Post-op Pain Management: Minimal or no pain anticipated   Induction: Intravenous  PONV Risk Score and Plan: 3 and Ondansetron, Propofol infusion and TIVA  Airway Management Planned: Nasal Cannula  Additional Equipment: None  Intra-op Plan:   Post-operative Plan:   Informed Consent: I have reviewed the patients History and Physical, chart, labs and discussed the procedure including the risks, benefits and alternatives for the proposed anesthesia with the patient or authorized  representative who has indicated his/her understanding and acceptance.     Dental advisory given  Plan Discussed with: CRNA and Surgeon  Anesthesia Plan Comments: (Discussed risks of anesthesia with patient, including possibility of difficulty with spontaneous ventilation under anesthesia necessitating airway intervention, PONV, and rare risks such as cardiac or respiratory or neurological events, and allergic reactions. Discussed the role of CRNA in patient's perioperative care. Patient understands. Patient counseled on benefits of smoking cessation, and increased perioperative risks associated with continued smoking. )       Anesthesia Quick Evaluation

## 2021-01-20 ENCOUNTER — Encounter: Payer: Self-pay | Admitting: Gastroenterology

## 2021-01-20 LAB — SURGICAL PATHOLOGY

## 2021-02-28 DIAGNOSIS — E1069 Type 1 diabetes mellitus with other specified complication: Secondary | ICD-10-CM | POA: Diagnosis not present

## 2021-02-28 DIAGNOSIS — E785 Hyperlipidemia, unspecified: Secondary | ICD-10-CM | POA: Diagnosis not present

## 2021-02-28 DIAGNOSIS — E039 Hypothyroidism, unspecified: Secondary | ICD-10-CM | POA: Diagnosis not present

## 2021-03-17 DIAGNOSIS — L905 Scar conditions and fibrosis of skin: Secondary | ICD-10-CM | POA: Diagnosis not present

## 2021-03-17 DIAGNOSIS — D0362 Melanoma in situ of left upper limb, including shoulder: Secondary | ICD-10-CM | POA: Diagnosis not present

## 2021-03-31 DIAGNOSIS — D0461 Carcinoma in situ of skin of right upper limb, including shoulder: Secondary | ICD-10-CM | POA: Diagnosis not present

## 2021-05-13 DIAGNOSIS — E785 Hyperlipidemia, unspecified: Secondary | ICD-10-CM | POA: Diagnosis not present

## 2021-05-13 DIAGNOSIS — Z79899 Other long term (current) drug therapy: Secondary | ICD-10-CM | POA: Diagnosis not present

## 2021-05-13 DIAGNOSIS — E1069 Type 1 diabetes mellitus with other specified complication: Secondary | ICD-10-CM | POA: Diagnosis not present

## 2021-05-20 DIAGNOSIS — Z0000 Encounter for general adult medical examination without abnormal findings: Secondary | ICD-10-CM | POA: Diagnosis not present

## 2021-07-29 DIAGNOSIS — D2261 Melanocytic nevi of right upper limb, including shoulder: Secondary | ICD-10-CM | POA: Diagnosis not present

## 2021-07-29 DIAGNOSIS — X32XXXA Exposure to sunlight, initial encounter: Secondary | ICD-10-CM | POA: Diagnosis not present

## 2021-07-29 DIAGNOSIS — D2262 Melanocytic nevi of left upper limb, including shoulder: Secondary | ICD-10-CM | POA: Diagnosis not present

## 2021-07-29 DIAGNOSIS — L570 Actinic keratosis: Secondary | ICD-10-CM | POA: Diagnosis not present

## 2021-07-29 DIAGNOSIS — L821 Other seborrheic keratosis: Secondary | ICD-10-CM | POA: Diagnosis not present

## 2021-07-29 DIAGNOSIS — D225 Melanocytic nevi of trunk: Secondary | ICD-10-CM | POA: Diagnosis not present

## 2021-07-29 DIAGNOSIS — D2272 Melanocytic nevi of left lower limb, including hip: Secondary | ICD-10-CM | POA: Diagnosis not present

## 2021-07-29 DIAGNOSIS — D2271 Melanocytic nevi of right lower limb, including hip: Secondary | ICD-10-CM | POA: Diagnosis not present

## 2021-08-29 DIAGNOSIS — E1069 Type 1 diabetes mellitus with other specified complication: Secondary | ICD-10-CM | POA: Diagnosis not present

## 2021-08-29 DIAGNOSIS — E039 Hypothyroidism, unspecified: Secondary | ICD-10-CM | POA: Diagnosis not present

## 2021-08-29 DIAGNOSIS — E785 Hyperlipidemia, unspecified: Secondary | ICD-10-CM | POA: Diagnosis not present

## 2021-10-02 DIAGNOSIS — M7551 Bursitis of right shoulder: Secondary | ICD-10-CM | POA: Diagnosis not present

## 2021-10-02 DIAGNOSIS — E109 Type 1 diabetes mellitus without complications: Secondary | ICD-10-CM | POA: Diagnosis not present

## 2021-10-02 DIAGNOSIS — M25511 Pain in right shoulder: Secondary | ICD-10-CM | POA: Diagnosis not present

## 2021-10-02 DIAGNOSIS — G8929 Other chronic pain: Secondary | ICD-10-CM | POA: Diagnosis not present

## 2021-10-30 ENCOUNTER — Other Ambulatory Visit: Payer: Self-pay | Admitting: Family Medicine

## 2021-10-30 DIAGNOSIS — Z1231 Encounter for screening mammogram for malignant neoplasm of breast: Secondary | ICD-10-CM

## 2021-11-12 DIAGNOSIS — E039 Hypothyroidism, unspecified: Secondary | ICD-10-CM | POA: Diagnosis not present

## 2021-11-12 DIAGNOSIS — Z79899 Other long term (current) drug therapy: Secondary | ICD-10-CM | POA: Diagnosis not present

## 2021-11-12 DIAGNOSIS — E1069 Type 1 diabetes mellitus with other specified complication: Secondary | ICD-10-CM | POA: Diagnosis not present

## 2021-11-12 DIAGNOSIS — E785 Hyperlipidemia, unspecified: Secondary | ICD-10-CM | POA: Diagnosis not present

## 2021-11-14 DIAGNOSIS — Z008 Encounter for other general examination: Secondary | ICD-10-CM | POA: Diagnosis not present

## 2021-11-14 DIAGNOSIS — E039 Hypothyroidism, unspecified: Secondary | ICD-10-CM | POA: Diagnosis not present

## 2021-11-14 DIAGNOSIS — Z6827 Body mass index (BMI) 27.0-27.9, adult: Secondary | ICD-10-CM | POA: Diagnosis not present

## 2021-11-14 DIAGNOSIS — E785 Hyperlipidemia, unspecified: Secondary | ICD-10-CM | POA: Diagnosis not present

## 2021-11-14 DIAGNOSIS — E1169 Type 2 diabetes mellitus with other specified complication: Secondary | ICD-10-CM | POA: Diagnosis not present

## 2021-11-14 DIAGNOSIS — Z794 Long term (current) use of insulin: Secondary | ICD-10-CM | POA: Diagnosis not present

## 2021-11-14 DIAGNOSIS — F17210 Nicotine dependence, cigarettes, uncomplicated: Secondary | ICD-10-CM | POA: Diagnosis not present

## 2021-11-14 DIAGNOSIS — E663 Overweight: Secondary | ICD-10-CM | POA: Diagnosis not present

## 2021-11-14 DIAGNOSIS — Z86010 Personal history of colonic polyps: Secondary | ICD-10-CM | POA: Diagnosis not present

## 2021-11-20 DIAGNOSIS — E039 Hypothyroidism, unspecified: Secondary | ICD-10-CM | POA: Diagnosis not present

## 2021-11-20 DIAGNOSIS — Z79899 Other long term (current) drug therapy: Secondary | ICD-10-CM | POA: Diagnosis not present

## 2021-11-20 DIAGNOSIS — E785 Hyperlipidemia, unspecified: Secondary | ICD-10-CM | POA: Diagnosis not present

## 2021-11-20 DIAGNOSIS — E1069 Type 1 diabetes mellitus with other specified complication: Secondary | ICD-10-CM | POA: Diagnosis not present

## 2021-11-24 ENCOUNTER — Ambulatory Visit
Admission: RE | Admit: 2021-11-24 | Discharge: 2021-11-24 | Disposition: A | Discharge status: Home / Self Care | Payer: No Typology Code available for payment source | Source: Ambulatory Visit | Attending: Family Medicine | Admitting: Family Medicine

## 2021-11-24 DIAGNOSIS — Z1231 Encounter for screening mammogram for malignant neoplasm of breast: Secondary | ICD-10-CM | POA: Insufficient documentation

## 2022-01-15 DIAGNOSIS — E109 Type 1 diabetes mellitus without complications: Secondary | ICD-10-CM | POA: Diagnosis not present

## 2022-01-15 DIAGNOSIS — G8929 Other chronic pain: Secondary | ICD-10-CM | POA: Diagnosis not present

## 2022-01-15 DIAGNOSIS — M5137 Other intervertebral disc degeneration, lumbosacral region: Secondary | ICD-10-CM | POA: Diagnosis not present

## 2022-01-15 DIAGNOSIS — Z6830 Body mass index (BMI) 30.0-30.9, adult: Secondary | ICD-10-CM | POA: Diagnosis not present

## 2022-01-15 DIAGNOSIS — M5442 Lumbago with sciatica, left side: Secondary | ICD-10-CM | POA: Diagnosis not present

## 2022-02-23 DIAGNOSIS — E785 Hyperlipidemia, unspecified: Secondary | ICD-10-CM | POA: Diagnosis not present

## 2022-02-23 DIAGNOSIS — E1069 Type 1 diabetes mellitus with other specified complication: Secondary | ICD-10-CM | POA: Diagnosis not present

## 2022-02-23 DIAGNOSIS — E039 Hypothyroidism, unspecified: Secondary | ICD-10-CM | POA: Diagnosis not present

## 2022-03-31 DIAGNOSIS — L570 Actinic keratosis: Secondary | ICD-10-CM | POA: Diagnosis not present

## 2022-03-31 DIAGNOSIS — D225 Melanocytic nevi of trunk: Secondary | ICD-10-CM | POA: Diagnosis not present

## 2022-03-31 DIAGNOSIS — Z85828 Personal history of other malignant neoplasm of skin: Secondary | ICD-10-CM | POA: Diagnosis not present

## 2022-03-31 DIAGNOSIS — D2272 Melanocytic nevi of left lower limb, including hip: Secondary | ICD-10-CM | POA: Diagnosis not present

## 2022-03-31 DIAGNOSIS — Z85820 Personal history of malignant melanoma of skin: Secondary | ICD-10-CM | POA: Diagnosis not present

## 2022-03-31 DIAGNOSIS — D2261 Melanocytic nevi of right upper limb, including shoulder: Secondary | ICD-10-CM | POA: Diagnosis not present

## 2022-03-31 DIAGNOSIS — L298 Other pruritus: Secondary | ICD-10-CM | POA: Diagnosis not present

## 2022-03-31 DIAGNOSIS — L538 Other specified erythematous conditions: Secondary | ICD-10-CM | POA: Diagnosis not present

## 2022-03-31 DIAGNOSIS — L820 Inflamed seborrheic keratosis: Secondary | ICD-10-CM | POA: Diagnosis not present

## 2022-03-31 DIAGNOSIS — D2262 Melanocytic nevi of left upper limb, including shoulder: Secondary | ICD-10-CM | POA: Diagnosis not present

## 2022-04-24 DIAGNOSIS — H0015 Chalazion left lower eyelid: Secondary | ICD-10-CM | POA: Diagnosis not present

## 2022-05-18 DIAGNOSIS — Z79899 Other long term (current) drug therapy: Secondary | ICD-10-CM | POA: Diagnosis not present

## 2022-05-18 DIAGNOSIS — E1069 Type 1 diabetes mellitus with other specified complication: Secondary | ICD-10-CM | POA: Diagnosis not present

## 2022-05-18 DIAGNOSIS — E039 Hypothyroidism, unspecified: Secondary | ICD-10-CM | POA: Diagnosis not present

## 2022-05-18 DIAGNOSIS — E785 Hyperlipidemia, unspecified: Secondary | ICD-10-CM | POA: Diagnosis not present

## 2022-05-25 DIAGNOSIS — E039 Hypothyroidism, unspecified: Secondary | ICD-10-CM | POA: Diagnosis not present

## 2022-05-25 DIAGNOSIS — Z780 Asymptomatic menopausal state: Secondary | ICD-10-CM | POA: Diagnosis not present

## 2022-05-25 DIAGNOSIS — Z1331 Encounter for screening for depression: Secondary | ICD-10-CM | POA: Diagnosis not present

## 2022-05-25 DIAGNOSIS — E1069 Type 1 diabetes mellitus with other specified complication: Secondary | ICD-10-CM | POA: Diagnosis not present

## 2022-05-25 DIAGNOSIS — Z79899 Other long term (current) drug therapy: Secondary | ICD-10-CM | POA: Diagnosis not present

## 2022-05-25 DIAGNOSIS — Z0000 Encounter for general adult medical examination without abnormal findings: Secondary | ICD-10-CM | POA: Diagnosis not present

## 2022-05-25 DIAGNOSIS — E785 Hyperlipidemia, unspecified: Secondary | ICD-10-CM | POA: Diagnosis not present

## 2022-05-29 DIAGNOSIS — E1065 Type 1 diabetes mellitus with hyperglycemia: Secondary | ICD-10-CM | POA: Diagnosis not present

## 2022-05-29 DIAGNOSIS — H2513 Age-related nuclear cataract, bilateral: Secondary | ICD-10-CM | POA: Diagnosis not present

## 2022-06-01 DIAGNOSIS — M8588 Other specified disorders of bone density and structure, other site: Secondary | ICD-10-CM | POA: Diagnosis not present

## 2022-06-08 DIAGNOSIS — K52832 Lymphocytic colitis: Secondary | ICD-10-CM | POA: Diagnosis not present

## 2022-08-24 DIAGNOSIS — E039 Hypothyroidism, unspecified: Secondary | ICD-10-CM | POA: Diagnosis not present

## 2022-08-24 DIAGNOSIS — E785 Hyperlipidemia, unspecified: Secondary | ICD-10-CM | POA: Diagnosis not present

## 2022-08-24 DIAGNOSIS — E1069 Type 1 diabetes mellitus with other specified complication: Secondary | ICD-10-CM | POA: Diagnosis not present

## 2022-08-24 DIAGNOSIS — E109 Type 1 diabetes mellitus without complications: Secondary | ICD-10-CM | POA: Diagnosis not present

## 2022-09-06 IMAGING — MG MM DIGITAL SCREENING BILAT W/ TOMO AND CAD
8 series · 8 of 24 positions shown · non-contrast
Comparison: Previous exam(s).

CLINICAL DATA: Screening.

EXAM:
DIGITAL SCREENING BILATERAL MAMMOGRAM WITH TOMOSYNTHESIS AND CAD
TECHNIQUE: Bilateral screening digital craniocaudal and mediolateral oblique
mammograms were obtained. Bilateral screening digital breast
tomosynthesis was performed. The images were evaluated with
computer-aided detection.

[L CC synth-2D]
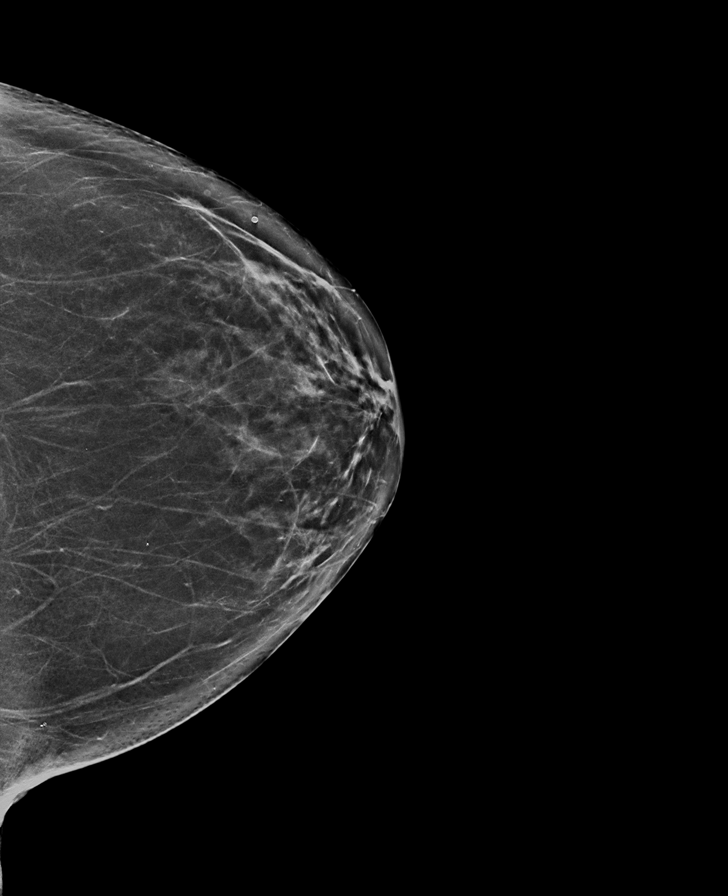

[L MLO synth-2D]
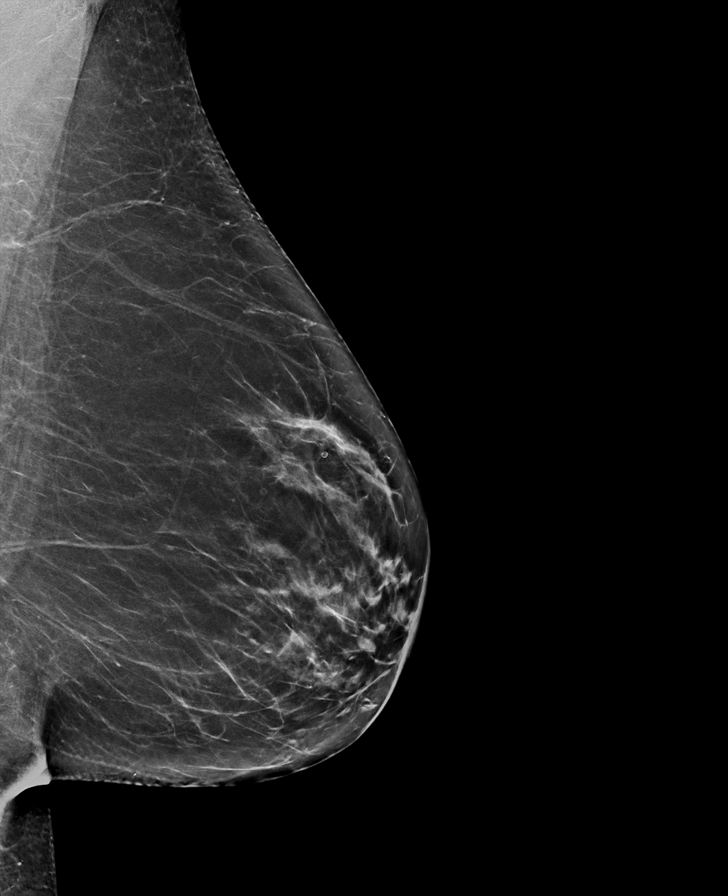

[R CC synth-2D]
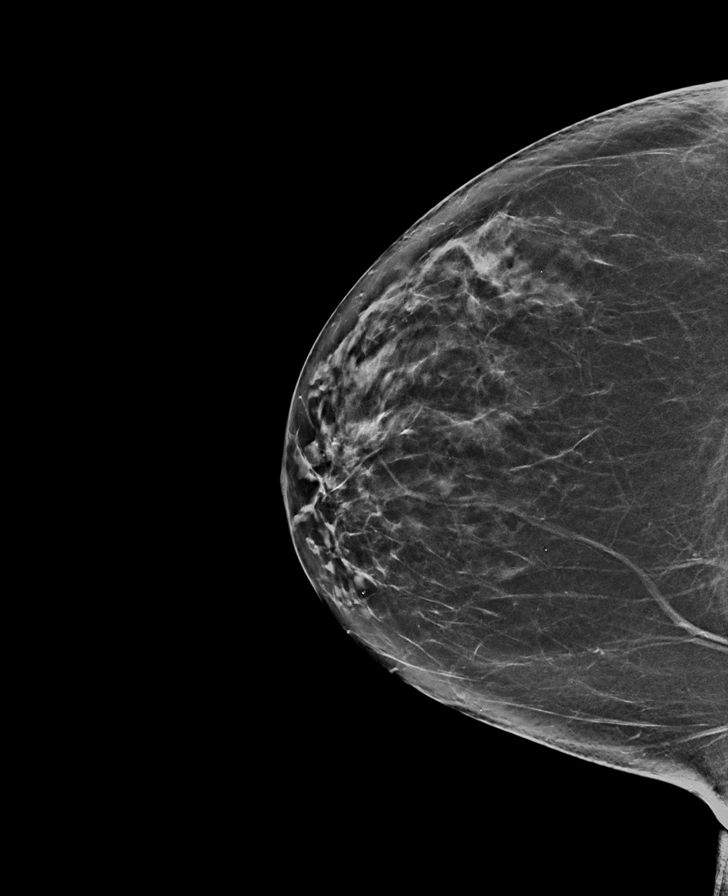

[R MLO synth-2D]
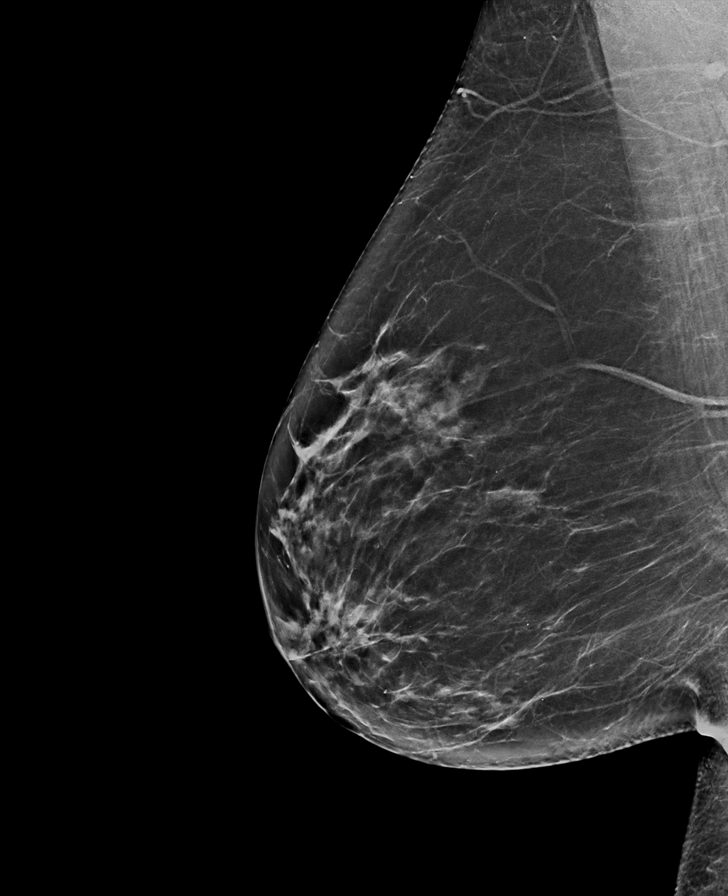

[R CC tomo · tomo slice 34/67.0]
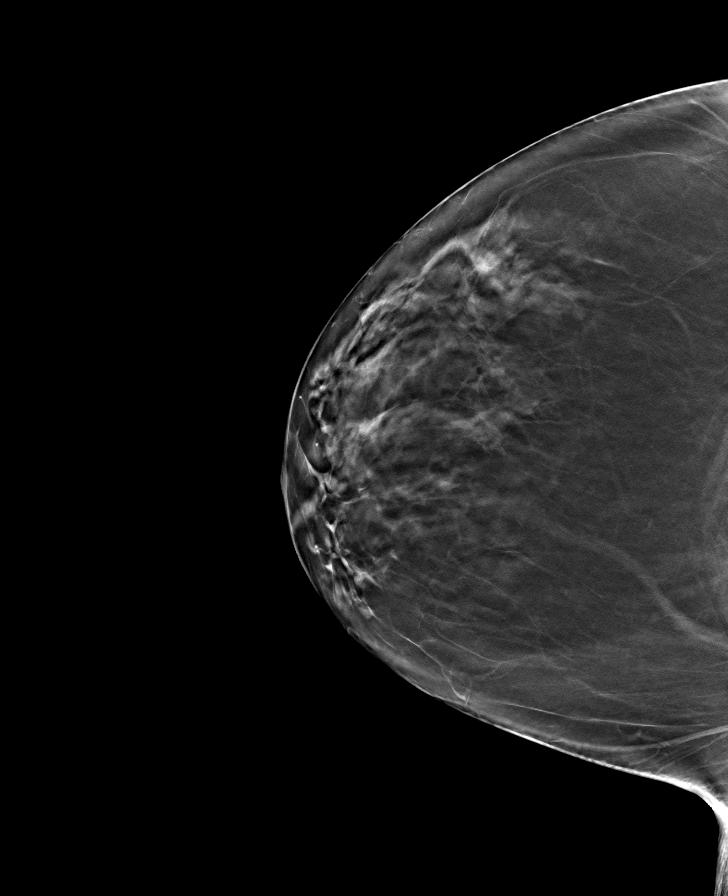

[L MLO tomo · tomo slice 37/72.0]
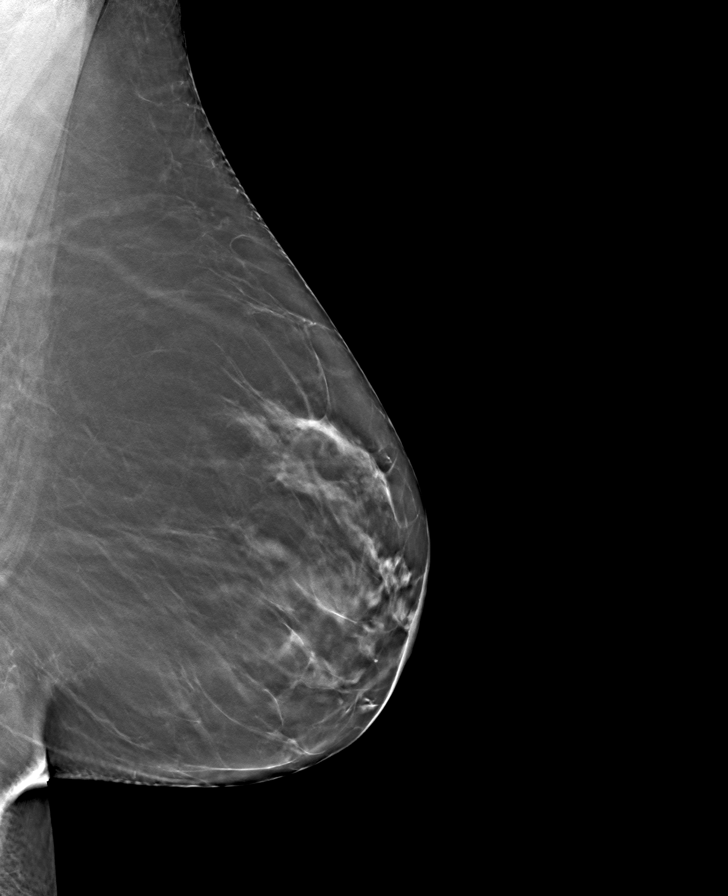

[R MLO tomo · tomo slice 36/71.0]
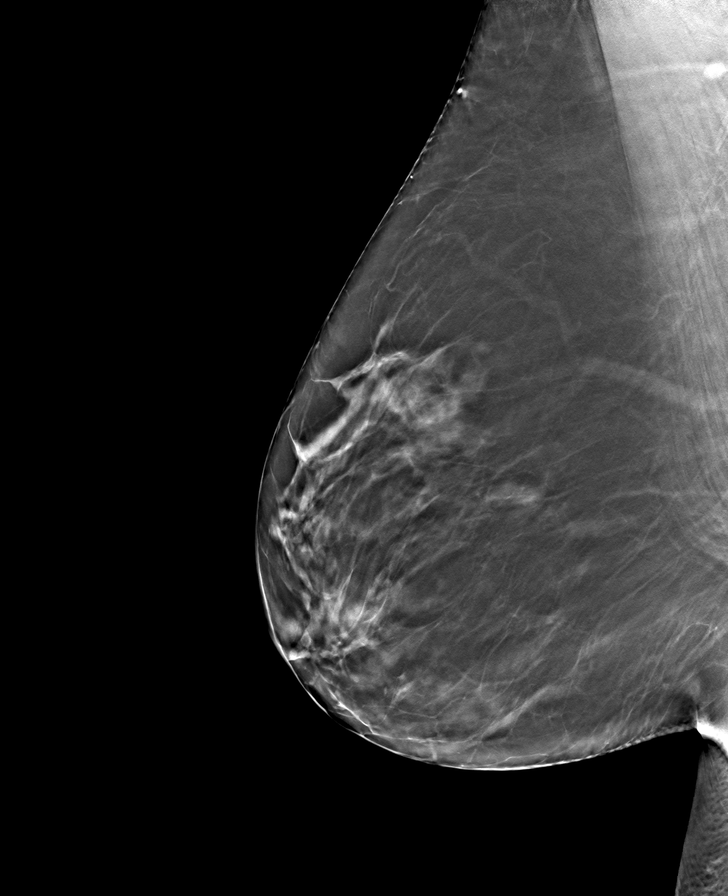

[L CC tomo · tomo slice 34/67.0]
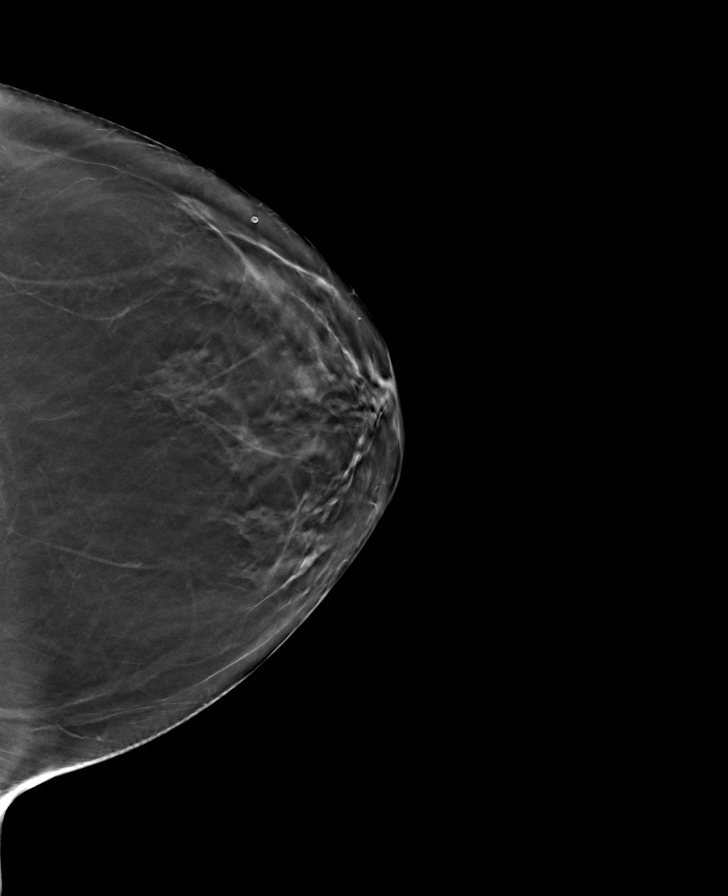

[8 of 24 positions shown; findings below may reference images not displayed]

ACR Breast Density Category b: There are scattered areas of
fibroglandular density.
FINDINGS: There are no findings suspicious for malignancy. The images were
evaluated with computer-aided detection.
IMPRESSION: No mammographic evidence of malignancy. A result letter of this
screening mammogram will be mailed directly to the patient.

RECOMMENDATION:
Screening mammogram in one year. (Code:WJ-I-BG6)

BI-RADS CATEGORY  1: Negative.

## 2022-11-19 ENCOUNTER — Other Ambulatory Visit: Payer: Self-pay | Admitting: Family Medicine

## 2022-11-19 DIAGNOSIS — Z1231 Encounter for screening mammogram for malignant neoplasm of breast: Secondary | ICD-10-CM

## 2022-11-19 DIAGNOSIS — E039 Hypothyroidism, unspecified: Secondary | ICD-10-CM | POA: Diagnosis not present

## 2022-11-19 DIAGNOSIS — Z79899 Other long term (current) drug therapy: Secondary | ICD-10-CM | POA: Diagnosis not present

## 2022-11-19 DIAGNOSIS — E785 Hyperlipidemia, unspecified: Secondary | ICD-10-CM | POA: Diagnosis not present

## 2022-11-19 DIAGNOSIS — E1069 Type 1 diabetes mellitus with other specified complication: Secondary | ICD-10-CM | POA: Diagnosis not present

## 2022-11-26 DIAGNOSIS — Z79899 Other long term (current) drug therapy: Secondary | ICD-10-CM | POA: Diagnosis not present

## 2022-11-26 DIAGNOSIS — E039 Hypothyroidism, unspecified: Secondary | ICD-10-CM | POA: Diagnosis not present

## 2022-11-26 DIAGNOSIS — E1069 Type 1 diabetes mellitus with other specified complication: Secondary | ICD-10-CM | POA: Diagnosis not present

## 2022-11-26 DIAGNOSIS — I10 Essential (primary) hypertension: Secondary | ICD-10-CM | POA: Diagnosis not present

## 2022-11-26 DIAGNOSIS — E785 Hyperlipidemia, unspecified: Secondary | ICD-10-CM | POA: Diagnosis not present

## 2022-11-26 DIAGNOSIS — M720 Palmar fascial fibromatosis [Dupuytren]: Secondary | ICD-10-CM | POA: Diagnosis not present

## 2022-12-01 DIAGNOSIS — L820 Inflamed seborrheic keratosis: Secondary | ICD-10-CM | POA: Diagnosis not present

## 2022-12-01 DIAGNOSIS — D485 Neoplasm of uncertain behavior of skin: Secondary | ICD-10-CM | POA: Diagnosis not present

## 2022-12-01 DIAGNOSIS — D2261 Melanocytic nevi of right upper limb, including shoulder: Secondary | ICD-10-CM | POA: Diagnosis not present

## 2022-12-01 DIAGNOSIS — L570 Actinic keratosis: Secondary | ICD-10-CM | POA: Diagnosis not present

## 2022-12-01 DIAGNOSIS — D0462 Carcinoma in situ of skin of left upper limb, including shoulder: Secondary | ICD-10-CM | POA: Diagnosis not present

## 2022-12-01 DIAGNOSIS — L538 Other specified erythematous conditions: Secondary | ICD-10-CM | POA: Diagnosis not present

## 2022-12-01 DIAGNOSIS — D225 Melanocytic nevi of trunk: Secondary | ICD-10-CM | POA: Diagnosis not present

## 2022-12-01 DIAGNOSIS — D2262 Melanocytic nevi of left upper limb, including shoulder: Secondary | ICD-10-CM | POA: Diagnosis not present

## 2022-12-01 DIAGNOSIS — D2271 Melanocytic nevi of right lower limb, including hip: Secondary | ICD-10-CM | POA: Diagnosis not present

## 2022-12-01 DIAGNOSIS — B078 Other viral warts: Secondary | ICD-10-CM | POA: Diagnosis not present

## 2022-12-01 DIAGNOSIS — L821 Other seborrheic keratosis: Secondary | ICD-10-CM | POA: Diagnosis not present

## 2022-12-01 DIAGNOSIS — D2272 Melanocytic nevi of left lower limb, including hip: Secondary | ICD-10-CM | POA: Diagnosis not present

## 2022-12-03 ENCOUNTER — Ambulatory Visit
Admission: RE | Admit: 2022-12-03 | Discharge: 2022-12-03 | Disposition: A | Discharge status: Home / Self Care | Payer: No Typology Code available for payment source | Source: Ambulatory Visit | Attending: Family Medicine | Admitting: Family Medicine

## 2022-12-03 DIAGNOSIS — Z1231 Encounter for screening mammogram for malignant neoplasm of breast: Secondary | ICD-10-CM | POA: Diagnosis not present

## 2023-01-14 DIAGNOSIS — D0462 Carcinoma in situ of skin of left upper limb, including shoulder: Secondary | ICD-10-CM | POA: Diagnosis not present

## 2023-02-22 DIAGNOSIS — E109 Type 1 diabetes mellitus without complications: Secondary | ICD-10-CM | POA: Diagnosis not present

## 2023-02-22 DIAGNOSIS — E039 Hypothyroidism, unspecified: Secondary | ICD-10-CM | POA: Diagnosis not present

## 2023-02-22 DIAGNOSIS — E1069 Type 1 diabetes mellitus with other specified complication: Secondary | ICD-10-CM | POA: Diagnosis not present

## 2023-02-22 DIAGNOSIS — E785 Hyperlipidemia, unspecified: Secondary | ICD-10-CM | POA: Diagnosis not present

## 2023-05-24 DIAGNOSIS — Z79899 Other long term (current) drug therapy: Secondary | ICD-10-CM | POA: Diagnosis not present

## 2023-05-24 DIAGNOSIS — E039 Hypothyroidism, unspecified: Secondary | ICD-10-CM | POA: Diagnosis not present

## 2023-05-24 DIAGNOSIS — E785 Hyperlipidemia, unspecified: Secondary | ICD-10-CM | POA: Diagnosis not present

## 2023-05-24 DIAGNOSIS — E1069 Type 1 diabetes mellitus with other specified complication: Secondary | ICD-10-CM | POA: Diagnosis not present

## 2023-06-01 DIAGNOSIS — H43813 Vitreous degeneration, bilateral: Secondary | ICD-10-CM | POA: Diagnosis not present

## 2023-06-01 DIAGNOSIS — H40003 Preglaucoma, unspecified, bilateral: Secondary | ICD-10-CM | POA: Diagnosis not present

## 2023-06-01 DIAGNOSIS — E119 Type 2 diabetes mellitus without complications: Secondary | ICD-10-CM | POA: Diagnosis not present

## 2023-06-01 DIAGNOSIS — H2513 Age-related nuclear cataract, bilateral: Secondary | ICD-10-CM | POA: Diagnosis not present

## 2023-06-03 DIAGNOSIS — E039 Hypothyroidism, unspecified: Secondary | ICD-10-CM | POA: Diagnosis not present

## 2023-06-03 DIAGNOSIS — Z1331 Encounter for screening for depression: Secondary | ICD-10-CM | POA: Diagnosis not present

## 2023-06-03 DIAGNOSIS — Z79899 Other long term (current) drug therapy: Secondary | ICD-10-CM | POA: Diagnosis not present

## 2023-06-03 DIAGNOSIS — E785 Hyperlipidemia, unspecified: Secondary | ICD-10-CM | POA: Diagnosis not present

## 2023-06-03 DIAGNOSIS — E1069 Type 1 diabetes mellitus with other specified complication: Secondary | ICD-10-CM | POA: Diagnosis not present

## 2023-06-03 DIAGNOSIS — Z0000 Encounter for general adult medical examination without abnormal findings: Secondary | ICD-10-CM | POA: Diagnosis not present

## 2023-06-03 DIAGNOSIS — I10 Essential (primary) hypertension: Secondary | ICD-10-CM | POA: Diagnosis not present

## 2023-06-07 DIAGNOSIS — D225 Melanocytic nevi of trunk: Secondary | ICD-10-CM | POA: Diagnosis not present

## 2023-06-07 DIAGNOSIS — D2261 Melanocytic nevi of right upper limb, including shoulder: Secondary | ICD-10-CM | POA: Diagnosis not present

## 2023-06-07 DIAGNOSIS — B078 Other viral warts: Secondary | ICD-10-CM | POA: Diagnosis not present

## 2023-06-07 DIAGNOSIS — L570 Actinic keratosis: Secondary | ICD-10-CM | POA: Diagnosis not present

## 2023-06-07 DIAGNOSIS — D2272 Melanocytic nevi of left lower limb, including hip: Secondary | ICD-10-CM | POA: Diagnosis not present

## 2023-06-07 DIAGNOSIS — L0889 Other specified local infections of the skin and subcutaneous tissue: Secondary | ICD-10-CM | POA: Diagnosis not present

## 2023-06-07 DIAGNOSIS — L538 Other specified erythematous conditions: Secondary | ICD-10-CM | POA: Diagnosis not present

## 2023-06-07 DIAGNOSIS — D2262 Melanocytic nevi of left upper limb, including shoulder: Secondary | ICD-10-CM | POA: Diagnosis not present

## 2023-06-07 DIAGNOSIS — Z85828 Personal history of other malignant neoplasm of skin: Secondary | ICD-10-CM | POA: Diagnosis not present

## 2023-08-23 DIAGNOSIS — E039 Hypothyroidism, unspecified: Secondary | ICD-10-CM | POA: Diagnosis not present

## 2023-08-23 DIAGNOSIS — E1069 Type 1 diabetes mellitus with other specified complication: Secondary | ICD-10-CM | POA: Diagnosis not present

## 2023-08-23 DIAGNOSIS — E785 Hyperlipidemia, unspecified: Secondary | ICD-10-CM | POA: Diagnosis not present

## 2023-08-23 DIAGNOSIS — E109 Type 1 diabetes mellitus without complications: Secondary | ICD-10-CM | POA: Diagnosis not present

## 2023-09-06 NOTE — Progress Notes (Signed)
 [facility] Quality Team Note  Name: SANELA EVOLA Date of Birth: 01-Apr-1955 MRN: [id number] Date: 09/06/2023  [facility] Quality Team has reviewed this patient's chart, please see recommendations below:  [facility] Quality Other; (CHART REVIEWED FOR KED MEASURE. NO RECORDS FOUND. SENT COMMUNICATION TO [facility])

## 2023-11-18 DIAGNOSIS — K52832 Lymphocytic colitis: Secondary | ICD-10-CM | POA: Diagnosis not present

## 2023-11-25 ENCOUNTER — Other Ambulatory Visit: Payer: Self-pay | Admitting: Family Medicine

## 2023-11-25 DIAGNOSIS — Z1231 Encounter for screening mammogram for malignant neoplasm of breast: Secondary | ICD-10-CM

## 2023-12-29 ENCOUNTER — Ambulatory Visit
Admission: RE | Admit: 2023-12-29 | Discharge: 2023-12-29 | Disposition: A | Discharge status: Home / Self Care | Source: Ambulatory Visit | Attending: Family Medicine | Admitting: Family Medicine

## 2023-12-29 DIAGNOSIS — Z1231 Encounter for screening mammogram for malignant neoplasm of breast: Secondary | ICD-10-CM | POA: Insufficient documentation
